# Patient Record
Sex: Male | Born: 1990 | Race: Black or African American | Hispanic: No | Marital: Single | Smoking: Former smoker
Health system: Southern US, Community
[De-identification: ages and names within clinical notes are randomized; demographics above are authoritative.]

## PROBLEM LIST (undated history)

## (undated) DIAGNOSIS — R569 Unspecified convulsions: Secondary | ICD-10-CM

## (undated) HISTORY — PX: KNEE SURGERY: SHX244

---
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
## 2011-03-05 ENCOUNTER — Emergency Department (HOSPITAL_COMMUNITY)
Admission: EM | Admit: 2011-03-05 | Discharge: 2011-03-05 | Disposition: A | Discharge status: Home / Self Care | Payer: Self-pay | Attending: Emergency Medicine | Admitting: Emergency Medicine

## 2011-03-05 DIAGNOSIS — N342 Other urethritis: Secondary | ICD-10-CM | POA: Insufficient documentation

## 2011-03-05 DIAGNOSIS — A64 Unspecified sexually transmitted disease: Secondary | ICD-10-CM | POA: Insufficient documentation

## 2011-03-12 ENCOUNTER — Emergency Department (HOSPITAL_COMMUNITY)
Admission: EM | Admit: 2011-03-12 | Discharge: 2011-03-12 | Disposition: A | Discharge status: Home / Self Care | Payer: Self-pay | Attending: Emergency Medicine | Admitting: Emergency Medicine

## 2011-03-12 DIAGNOSIS — R369 Urethral discharge, unspecified: Secondary | ICD-10-CM | POA: Insufficient documentation

## 2011-03-12 DIAGNOSIS — A64 Unspecified sexually transmitted disease: Secondary | ICD-10-CM | POA: Insufficient documentation

## 2012-04-25 ENCOUNTER — Emergency Department (HOSPITAL_COMMUNITY)
Admission: EM | Admit: 2012-04-25 | Discharge: 2012-04-25 | Disposition: A | Discharge status: Home / Self Care | Payer: Self-pay | Attending: Emergency Medicine | Admitting: Emergency Medicine

## 2012-04-25 ENCOUNTER — Encounter (HOSPITAL_COMMUNITY): Payer: Self-pay | Admitting: *Deleted

## 2012-04-25 ENCOUNTER — Emergency Department (HOSPITAL_COMMUNITY): Payer: Self-pay

## 2012-04-25 DIAGNOSIS — S62329A Displaced fracture of shaft of unspecified metacarpal bone, initial encounter for closed fracture: Secondary | ICD-10-CM | POA: Insufficient documentation

## 2012-04-25 DIAGNOSIS — F17200 Nicotine dependence, unspecified, uncomplicated: Secondary | ICD-10-CM | POA: Insufficient documentation

## 2012-04-25 DIAGNOSIS — M7989 Other specified soft tissue disorders: Secondary | ICD-10-CM | POA: Insufficient documentation

## 2012-04-25 DIAGNOSIS — S60229A Contusion of unspecified hand, initial encounter: Secondary | ICD-10-CM | POA: Insufficient documentation

## 2012-04-25 DIAGNOSIS — M79609 Pain in unspecified limb: Secondary | ICD-10-CM | POA: Insufficient documentation

## 2012-04-25 DIAGNOSIS — IMO0002 Reserved for concepts with insufficient information to code with codable children: Secondary | ICD-10-CM | POA: Insufficient documentation

## 2012-04-25 MED ORDER — TETANUS-DIPHTHERIA TOXOIDS TD 5-2 LFU IM INJ
0.5000 mL | INJECTION | Freq: Once | INTRAMUSCULAR | Status: AC
  Administered 2012-04-25: 0.5 mL via INTRAMUSCULAR
  Filled 2012-04-25: qty 0.5

## 2012-04-25 MED ORDER — OXYCODONE-ACETAMINOPHEN 5-325 MG PO TABS
1.0000 | ORAL_TABLET | Freq: Once | ORAL | Status: AC
  Administered 2012-04-25: 1 via ORAL
  Filled 2012-04-25: qty 1

## 2012-04-25 MED ORDER — OXYCODONE-ACETAMINOPHEN 5-325 MG PO TABS: 1.0000 | ORAL_TABLET | Freq: Four times a day (QID) | ORAL | Status: DC | PRN

## 2012-04-25 NOTE — ED Provider Notes (Signed)
Medical screening examination/treatment/procedure(s) were conducted as a shared visit with non-physician practitioner(s) and myself.  I personally evaluated the patient during the encounter Rhd.  Punched door.  C/o r hand pain and swelling.  + edema and ecchymosis over dorsolateral right hand.  xr + fx with angulation.  Not reduced after attempt in ed. Will discuss with hand surgeon.  Cheri Guppy, MD 04/25/12 1352

## 2012-04-25 NOTE — ED Notes (Signed)
Ortho tech at bedside applying splint. 

## 2012-04-25 NOTE — ED Notes (Signed)
Pt hit a car window with his rt hand, swelling to rt hand and 5th digit

## 2012-04-25 NOTE — ED Provider Notes (Signed)
Medical screening examination/treatment/procedure(s) were conducted as a shared visit with non-physician practitioner(s) and myself.  I personally evaluated the patient during the encounter  Cheri Guppy, MD 04/25/12 1537

## 2012-04-25 NOTE — ED Notes (Signed)
Ortho tech called for splint.

## 2012-04-25 NOTE — ED Provider Notes (Signed)
History     CSN: [id number]  Arrival date & time 04/25/12  1223   First MD Initiated Contact with Patient 04/25/12 1240      Chief Complaint  Patient presents with  . Hand Injury    (Consider location/radiation/quality/duration/timing/severity/associated sxs/prior treatment) HPI Comments: Patient presents emergency department chief complaint of right hand pain.  Earlier this morning patient punched a car window and had immediate swelling to his right hand proximal to fifth digit. He is right hand dominant.  Pain rated at a 4-5/10 and he denies any open wound or inability to move wrist or fingers. He also denies numbness or tingling of extremity or decreased sensation . No other complaints at this time.   Patient is a 21 y.o. male presenting with hand injury. The history is provided by the patient.  Hand Injury  Pertinent negatives include no fever.    History reviewed. No pertinent past medical history.  History reviewed. No pertinent past surgical history.  No family history on file.  History  Substance Use Topics  . Smoking status: Current Everyday Smoker  . Smokeless tobacco: Not on file  . Alcohol Use: No      Review of Systems  Constitutional: Negative for fever.  Skin: Negative for color change and wound.  All other systems reviewed and are negative.    Allergies  Review of patient's allergies indicates no known allergies.  Home Medications  No current outpatient prescriptions on file.  BP 140/79  Pulse 92  Temp 99.2 F (37.3 C) (Oral)  Resp 18  SpO2 100%  Physical Exam  Nursing note and vitals reviewed. Constitutional: He is oriented to person, place, and time. He appears well-developed and well-nourished. No distress.  HENT:  Head: Normocephalic and atraumatic.  Eyes: Conjunctivae and EOM are normal.  Neck: Normal range of motion.  Cardiovascular:       Distal pulses intact   Pulmonary/Chest: Effort normal.  Musculoskeletal: Normal range of  motion.       FROM of DIP & PIP of 5th digit. No pain w wrist ROM. Pt unable to perform flexion of MCPs.  Neurological: He is alert and oriented to person, place, and time.       Intact sensation  Skin: Skin is warm and dry. No rash noted. He is not diaphoretic.       Skin tenting over right mcp dorsal surface. Skin intact.  Psychiatric: He has a normal mood and affect. His behavior is normal.    ED Course  Procedures (including critical care time)  Labs Reviewed - No data to display Dg Hand Complete Right  04/25/2012  *RADIOLOGY REPORT*  Clinical Data: Punched window.  Hand pain and swelling.  RIGHT HAND - COMPLETE 3+ VIEW  Comparison: None.  Findings: A fracture of the distal fifth metacarpal shaft is seen with dorsal displacement by an entire shaft's width, and marked volar angulation of the distal fracture fragment.  No other fractures are identified.  No evidence of dislocation.  IMPRESSION: Distal fifth metacarpal shaft fracture, with marked dorsal displacement and volar angulation.  Original Report Authenticated By: Danae Orleans, M.D.     No diagnosis found.   MDM  Fifth metacarpal shaft fracture w almost 90 degree volar angulation  Patient X-Ray reviewed as above & discussed w patient. Pain managed in ED. Pt advised to call hand tomorrow morning. Pt placed in ulnar gutter splint. Patient given brace while in ED, conservative therapy recommended and discussed. Patient will be dc  home & is agreeable with above plan.  Consult Dr. Mina Marble, splint and call office first thing tomorrow morning, likely surgery tomorrow or tuesday           Jaci Carrel, [place] 04/25/12 1359

## 2012-04-26 ENCOUNTER — Other Ambulatory Visit: Payer: Self-pay | Admitting: Orthopedic Surgery

## 2012-04-27 ENCOUNTER — Encounter (HOSPITAL_COMMUNITY)
Admission: RE | Admit: 2012-04-27 | Discharge: 2012-04-27 | Disposition: A | Discharge status: Home / Self Care | Payer: Self-pay | Source: Ambulatory Visit | Attending: Orthopedic Surgery | Admitting: Orthopedic Surgery

## 2012-04-27 ENCOUNTER — Encounter (HOSPITAL_COMMUNITY): Payer: Self-pay | Admitting: Pharmacy Technician

## 2012-04-27 ENCOUNTER — Encounter (HOSPITAL_COMMUNITY): Payer: Self-pay

## 2012-04-27 HISTORY — DX: Unspecified convulsions: R56.9

## 2012-04-27 LAB — CBC
HCT: 44.6 % (ref 39.0–52.0)
MCV: 90.7 fL (ref 78.0–100.0)
RBC: 4.92 MIL/uL (ref 4.22–5.81)
WBC: 4.6 10*3/uL (ref 4.0–10.5)

## 2012-04-27 LAB — SURGICAL PCR SCREEN: Staphylococcus aureus: NEGATIVE

## 2012-04-27 MED ORDER — CEFAZOLIN SODIUM-DEXTROSE 2-3 GM-% IV SOLR
2.0000 g | INTRAVENOUS | Status: AC
  Administered 2012-04-28: 2 g via INTRAVENOUS
  Filled 2012-04-27: qty 50

## 2012-04-27 NOTE — Pre-Procedure Instructions (Signed)
21 Oscar Bowers  04/27/2012   Your procedure is scheduled on:  Wednesday July 17. 2013  Report to [facility] at 11:20 AM.  Call this number if you have problems the morning of surgery: [phone]   Remember:   Do not eat food or drink liquids:After Midnight.    Take these medicines the morning of surgery with A SIP OF WATER: oxycodone   Do not wear jewelry, make-up or nail polish.  Do not wear lotions, powders, or perfumes. You may wear deodorant.  Do not shave 48 hours prior to surgery. Men may shave face and neck.  Do not bring valuables to the hospital.  Contacts, dentures or bridgework may not be worn into surgery.  Leave suitcase in the car. After surgery it may be brought to your room.  For patients admitted to the hospital, checkout time is 11:00 AM the day of discharge.   Patients discharged the day of surgery will not be allowed to drive home.  Name and phone number of your driver: Keith Rake [phone]  Special Instructions: Incentive Spirometry - Practice and bring it with you on the day of surgery. and CHG Shower Use Special Wash: 1/2 bottle night before surgery and 1/2 bottle morning of surgery.   Please read over the following fact sheets that you were given: Pain Booklet, Coughing and Deep Breathing, MRSA Information and Surgical Site Infection Prevention

## 2012-04-28 ENCOUNTER — Ambulatory Visit (HOSPITAL_COMMUNITY): Payer: Self-pay | Admitting: Anesthesiology

## 2012-04-28 ENCOUNTER — Encounter (HOSPITAL_COMMUNITY): Payer: Self-pay | Admitting: Anesthesiology

## 2012-04-28 ENCOUNTER — Encounter (HOSPITAL_COMMUNITY): Payer: Self-pay | Admitting: *Deleted

## 2012-04-28 ENCOUNTER — Encounter (HOSPITAL_COMMUNITY)
Admission: RE | Disposition: A | Discharge status: Home / Self Care | Payer: Self-pay | Source: Ambulatory Visit | Attending: Orthopedic Surgery

## 2012-04-28 ENCOUNTER — Ambulatory Visit (HOSPITAL_COMMUNITY)
Admission: RE | Admit: 2012-04-28 | Discharge: 2012-04-28 | Disposition: A | Discharge status: Home / Self Care | Payer: Self-pay | Source: Ambulatory Visit | Attending: Orthopedic Surgery | Admitting: Orthopedic Surgery

## 2012-04-28 DIAGNOSIS — S62309A Unspecified fracture of unspecified metacarpal bone, initial encounter for closed fracture: Secondary | ICD-10-CM | POA: Insufficient documentation

## 2012-04-28 DIAGNOSIS — Z01812 Encounter for preprocedural laboratory examination: Secondary | ICD-10-CM | POA: Insufficient documentation

## 2012-04-28 DIAGNOSIS — F17200 Nicotine dependence, unspecified, uncomplicated: Secondary | ICD-10-CM | POA: Insufficient documentation

## 2012-04-28 DIAGNOSIS — Y33XXXA Other specified events, undetermined intent, initial encounter: Secondary | ICD-10-CM | POA: Insufficient documentation

## 2012-04-28 DIAGNOSIS — Y998 Other external cause status: Secondary | ICD-10-CM | POA: Insufficient documentation

## 2012-04-28 HISTORY — PX: ORIF FINGER FRACTURE: SHX2122

## 2012-04-28 SURGERY — OPEN REDUCTION INTERNAL FIXATION (ORIF) METACARPAL (FINGER) FRACTURE
Anesthesia: General | Site: Hand | Laterality: Right | Wound class: Clean

## 2012-04-28 MED ORDER — HYDROMORPHONE HCL PF 1 MG/ML IJ SOLN
0.2500 mg | INTRAMUSCULAR | Status: DC | PRN
  Administered 2012-04-28 (×3): 0.5 mg via INTRAVENOUS

## 2012-04-28 MED ORDER — LACTATED RINGERS IV SOLN
INTRAVENOUS | Status: DC
  Administered 2012-04-28: 12:00:00 via INTRAVENOUS

## 2012-04-28 MED ORDER — MIDAZOLAM HCL 5 MG/5ML IJ SOLN
INTRAMUSCULAR | Status: DC | PRN
  Administered 2012-04-28: 2 mg via INTRAVENOUS

## 2012-04-28 MED ORDER — ONDANSETRON HCL 4 MG/2ML IJ SOLN
INTRAMUSCULAR | Status: DC | PRN
  Administered 2012-04-28: 4 mg via INTRAVENOUS

## 2012-04-28 MED ORDER — OXYCODONE-ACETAMINOPHEN 5-325 MG PO TABS: 1.0000 | ORAL_TABLET | ORAL | Status: AC | PRN

## 2012-04-28 MED ORDER — OXYCODONE-ACETAMINOPHEN 5-325 MG PO TABS
1.0000 | ORAL_TABLET | Freq: Once | ORAL | Status: AC
  Administered 2012-04-28: 1 via ORAL

## 2012-04-28 MED ORDER — BUPIVACAINE HCL (PF) 0.25 % IJ SOLN
INTRAMUSCULAR | Status: DC | PRN
  Administered 2012-04-28: 10 mL

## 2012-04-28 MED ORDER — OXYCODONE-ACETAMINOPHEN 5-325 MG PO TABS
ORAL_TABLET | ORAL | Status: AC
  Administered 2012-04-28: 1 via ORAL
  Filled 2012-04-28: qty 1

## 2012-04-28 MED ORDER — PROPOFOL 10 MG/ML IV EMUL
INTRAVENOUS | Status: DC | PRN
  Administered 2012-04-28: 200 mg via INTRAVENOUS

## 2012-04-28 MED ORDER — FENTANYL CITRATE 0.05 MG/ML IJ SOLN
INTRAMUSCULAR | Status: DC | PRN
  Administered 2012-04-28: 25 ug via INTRAVENOUS
  Administered 2012-04-28: 100 ug via INTRAVENOUS

## 2012-04-28 MED ORDER — HYDROMORPHONE HCL PF 1 MG/ML IJ SOLN
INTRAMUSCULAR | Status: AC
  Administered 2012-04-28: 0.5 mg via INTRAVENOUS
  Filled 2012-04-28: qty 1

## 2012-04-28 MED ORDER — LACTATED RINGERS IV SOLN
INTRAVENOUS | Status: DC | PRN
  Administered 2012-04-28: 13:00:00 via INTRAVENOUS

## 2012-04-28 MED ORDER — 0.9 % SODIUM CHLORIDE (POUR BTL) OPTIME
TOPICAL | Status: DC | PRN
  Administered 2012-04-28: 1000 mL

## 2012-04-28 MED ORDER — CHLORHEXIDINE GLUCONATE 4 % EX LIQD: 60.0000 mL | Freq: Once | CUTANEOUS | Status: DC

## 2012-04-28 MED ORDER — LIDOCAINE HCL (CARDIAC) 20 MG/ML IV SOLN
INTRAVENOUS | Status: DC | PRN
  Administered 2012-04-28: 100 mg via INTRAVENOUS

## 2012-04-28 MED ORDER — HYDROMORPHONE HCL PF 1 MG/ML IJ SOLN
INTRAMUSCULAR | Status: AC
  Filled 2012-04-28: qty 1

## 2012-04-28 MED ORDER — BUPIVACAINE HCL (PF) 0.25 % IJ SOLN
INTRAMUSCULAR | Status: AC
  Filled 2012-04-28: qty 30

## 2012-04-28 SURGICAL SUPPLY — 60 items
BANDAGE ELASTIC 3 VELCRO ST LF (GAUZE/BANDAGES/DRESSINGS) IMPLANT
BANDAGE ELASTIC 4 VELCRO ST LF (GAUZE/BANDAGES/DRESSINGS) ×2 IMPLANT
BANDAGE GAUZE ELAST BULKY 4 IN (GAUZE/BANDAGES/DRESSINGS) ×2 IMPLANT
BIT DRILL 1.1 (BIT) ×2
BIT DRILL 60X20X1.1XQC TMX (BIT) ×1 IMPLANT
BIT DRL 60X20X1.1XQC TMX (BIT) ×1
BNDG ADH 5X3 H2O RPLNT NS (GAUZE/BANDAGES/DRESSINGS) ×2
BNDG CMPR 9X4 STRL LF SNTH (GAUZE/BANDAGES/DRESSINGS) ×1
BNDG COHESIVE 1X5 TAN STRL LF (GAUZE/BANDAGES/DRESSINGS) IMPLANT
BNDG COHESIVE 3X5 WHT NS (GAUZE/BANDAGES/DRESSINGS) ×4 IMPLANT
BNDG ESMARK 4X9 LF (GAUZE/BANDAGES/DRESSINGS) ×2 IMPLANT
CLOTH BEACON ORANGE TIMEOUT ST (SAFETY) ×2 IMPLANT
CORDS BIPOLAR (ELECTRODE) ×2 IMPLANT
COVER SURGICAL LIGHT HANDLE (MISCELLANEOUS) ×2 IMPLANT
CUFF TOURNIQUET SINGLE 18IN (TOURNIQUET CUFF) IMPLANT
CUFF TOURNIQUET SINGLE 24IN (TOURNIQUET CUFF) IMPLANT
DRAPE OEC MINIVIEW 54X84 (DRAPES) IMPLANT
DRAPE SURG 17X23 STRL (DRAPES) ×2 IMPLANT
DURAPREP 26ML APPLICATOR (WOUND CARE) ×2 IMPLANT
GAUZE SPONGE 2X2 8PLY STRL LF (GAUZE/BANDAGES/DRESSINGS) IMPLANT
GAUZE XEROFORM 1X8 LF (GAUZE/BANDAGES/DRESSINGS) ×2 IMPLANT
GLOVE BIO SURGEON STRL SZ8.5 (GLOVE) ×2 IMPLANT
GOWN SRG XL XLNG 56XLVL 4 (GOWN DISPOSABLE) ×1 IMPLANT
GOWN STRL NON-REIN LRG LVL3 (GOWN DISPOSABLE) ×2 IMPLANT
GOWN STRL NON-REIN XL XLG LVL4 (GOWN DISPOSABLE) ×2
GOWN STRL REIN XL XLG (GOWN DISPOSABLE) ×2 IMPLANT
KIT BASIN OR (CUSTOM PROCEDURE TRAY) ×2 IMPLANT
KIT ROOM TURNOVER OR (KITS) ×2 IMPLANT
MANIFOLD NEPTUNE II (INSTRUMENTS) ×2 IMPLANT
NEEDLE HYPO 25GX1X1/2 BEV (NEEDLE) IMPLANT
NEEDLE HYPO 25X1 1.5 SAFETY (NEEDLE) ×2 IMPLANT
NS IRRIG 1000ML POUR BTL (IV SOLUTION) ×2 IMPLANT
PACK ORTHO EXTREMITY (CUSTOM PROCEDURE TRAY) ×2 IMPLANT
PAD ARMBOARD 7.5X6 YLW CONV (MISCELLANEOUS) ×4 IMPLANT
PAD CAST 3X4 CTTN HI CHSV (CAST SUPPLIES) ×2 IMPLANT
PAD CAST 4YDX4 CTTN HI CHSV (CAST SUPPLIES) ×1 IMPLANT
PADDING CAST COTTON 3X4 STRL (CAST SUPPLIES) ×4
PADDING CAST COTTON 4X4 STRL (CAST SUPPLIES) ×2
PLATE STRAIGHT LOCK 1.5 (Plate) ×2 IMPLANT
SCREW NL 1.5X12 (Screw) ×8 IMPLANT
SCREW NL 1.5X13 (Screw) ×2 IMPLANT
SCREW NONIOC 1.5 16M (Screw) ×2 IMPLANT
SPLINT PLASTER EXTRA FAST 3X15 (CAST SUPPLIES) ×1
SPLINT PLASTER GYPS XFAST 3X15 (CAST SUPPLIES) ×1 IMPLANT
SPONGE GAUZE 2X2 STER 10/PKG (GAUZE/BANDAGES/DRESSINGS)
SPONGE GAUZE 4X4 12PLY (GAUZE/BANDAGES/DRESSINGS) ×2 IMPLANT
STRIP CLOSURE SKIN 1/2X4 (GAUZE/BANDAGES/DRESSINGS) IMPLANT
SUCTION FRAZIER TIP 10 FR DISP (SUCTIONS) ×2 IMPLANT
SUT ETHILON 4 0 PS 2 18 (SUTURE) IMPLANT
SUT ETHILON 5 0 PS 2 18 (SUTURE) IMPLANT
SUT PROLENE 3 0 PS 2 (SUTURE) IMPLANT
SUT VIC AB 3-0 FS2 27 (SUTURE) IMPLANT
SUT VIC AB 4-0 P-3 18X BRD (SUTURE) IMPLANT
SUT VIC AB 4-0 P3 18 (SUTURE)
SYR CONTROL 10ML LL (SYRINGE) ×2 IMPLANT
TOWEL OR 17X24 6PK STRL BLUE (TOWEL DISPOSABLE) ×2 IMPLANT
TOWEL OR 17X26 10 PK STRL BLUE (TOWEL DISPOSABLE) ×2 IMPLANT
TUBE CONNECTING 12X1/4 (SUCTIONS) ×2 IMPLANT
UNDERPAD 30X30 INCONTINENT (UNDERPADS AND DIAPERS) ×2 IMPLANT
WATER STERILE IRR 1000ML POUR (IV SOLUTION) ×2 IMPLANT

## 2012-04-28 NOTE — Preoperative (Signed)
Beta Blockers   Reason not to administer Beta Blockers:Not Applicable 

## 2012-04-28 NOTE — Discharge Instructions (Signed)
Hand Fracture, Fifth Metacarpal  The small metacarpal is the bone at the base of the little finger between the knuckle and the wrist. A fracture is a break in that bone. One of the fractures that is common to this bone is called a Boxer's Fracture.  TREATMENT  These fractures can be treated with:    Reduction (bones moved back into place), then pinned through the skin to maintain the position, and then casted for about 6 weeks or as your caregiver determines necessary.   ORIF (open reduction and internal fixation) - the fracture site is opened and the bone pieces are fixed into place with pins and then casted for approximately 6 weeks or as your caregiver determines necessary.  Your caregiver will discuss the type of fracture you have and the treatment that should be best for that problem. If surgery is the treatment of choice, the following is information for you to know, and also let your caregiver know about prior to surgery.   LET YOUR CAREGIVER KNOW ABOUT:   Allergies.   Medications taken including herbs, eye drops, over the counter medications, and creams.   Use of steroids (by mouth or creams).   Previous problems with anesthetics or novocaine.   Possibility of pregnancy, if this applies.   History of blood clots (thrombophlebitis).   History of bleeding or blood problems.   Previous surgery.   Other health problems.  AFTER THE PROCEDURE  After surgery, you will be taken to the recovery area where a nurse will watch and check your progress. Once you're awake, stable, and taking fluids well, barring other problems you'll be allowed to go home. Once home an ice pack applied to your operative site may help with discomfort and keep the swelling down.  HOME CARE INSTRUCTIONS    Follow your caregiver's instructions as to activities, exercises, physical therapy, and driving a car.   Daily exercise is helpful for maintaining range of motion (movement and mobility) and strength. Exercise as  instructed.   To lessen swelling, keep the injured hand elevated above the level of your heart as much as possible.   Apply ice to the injury for 15 to 20 minutes each hour while awake for the first 2 days. Put the ice in a plastic bag and place a thin towel between the bag of ice and your cast.   Move the fingers of your casted hand at least several times a day.   If a plaster or fiberglass cast was applied:   Do not try to scratch the skin under the cast using a sharp or pointed object.   Check the skin around the cast every day. You may put lotion on red or sore areas.   Keep your cast dry. Your cast can be protected during bathing with a plastic bag. Do not put your cast into the water.   If a plaster splint was applied:   Wear the splint for as long as directed by your caregiver or until seen for follow-up examination.   Do not get your splint wet. Protect it during bathing with a plastic bag.   You may loosen the elastic bandage around the splint if your fingers start to get numb, tingle, get cold or turn blue.   Do not put pressure on your cast or splint; this may cause it to break. Especially, do not lean plaster casts on hard surfaces for 24 hours after application.   Take medications as directed by your caregiver.     Only take over-the-counter or prescription medicines for pain, discomfort, or fever as directed by your caregiver.   Follow all instructions for physician referrals, physical therapy, and rehabilitation. Any delay in obtaining necessary care could result in permanent injury, disability and chronic pain.  SEEK MEDICAL CARE IF:    Increased bleeding (more than a small spot) from the wound or from beneath your cast or splint if there is a wound beneath the cast from surgery.   Redness, swelling, or increasing pain in the wound or from beneath your cast or splint.   Pus coming from wound or from beneath your cast or splint.   An unexplained oral temperature above 102 F (38.9 C)  develops.   A foul smell coming from the wound or dressing or from beneath your cast or splint.   You are unable to move your little finger.  SEEK IMMEDIATE MEDICAL CARE IF:   You develop a rash, have difficulty breathing, or have any allergy problems.  If you do not have a window in your cast for observing the wound, a discharge or minor bleeding may show up as a stain on the outside of your cast. Report these findings to your caregiver.  MAKE SURE YOU:    Understand these instructions.   Will watch your condition.   Will get help right away if you are not doing well or get worse.  Document Released: 01/05/2001 Document Revised: 09/18/2011 Document Reviewed: 05/18/2008  ExitCare Patient Information 2012 [employer].

## 2012-04-28 NOTE — Anesthesia Preprocedure Evaluation (Addendum)
Anesthesia Evaluation  Patient identified by MRN, date of birth, ID band Patient awake    Reviewed: Allergy & Precautions, H&P , NPO status , Patient's Chart, lab work & pertinent test results  Airway Mallampati: I TM Distance: >3 FB Neck ROM: Full    Dental No notable dental hx. (+) Teeth Intact   Pulmonary Current Smoker,  breath sounds clear to auscultation  Pulmonary exam normal       Cardiovascular negative cardio ROS  Rhythm:Regular Rate:Normal     Neuro/Psych Seizures -,  Last Sz age 33 negative psych ROS   GI/Hepatic negative GI ROS, Neg liver ROS,   Endo/Other  negative endocrine ROS  Renal/GU negative Renal ROS  negative genitourinary   Musculoskeletal   Abdominal   Peds  Hematology negative hematology ROS (+)   Anesthesia Other Findings   Reproductive/Obstetrics                          Anesthesia Physical Anesthesia Plan  ASA: II  Anesthesia Plan: General   Post-op Pain Management:    Induction: Intravenous  Airway Management Planned: LMA  Additional Equipment:   Intra-op Plan:   Post-operative Plan: Extubation in OR  Informed Consent: I have reviewed the patients History and Physical, chart, labs and discussed the procedure including the risks, benefits and alternatives for the proposed anesthesia with the patient or authorized representative who has indicated his/her understanding and acceptance.   Dental advisory given  Plan Discussed with: CRNA, Anesthesiologist and Surgeon  Anesthesia Plan Comments:         Anesthesia Quick Evaluation

## 2012-04-28 NOTE — Op Note (Signed)
See dictated note [id number]

## 2012-04-28 NOTE — Anesthesia Postprocedure Evaluation (Signed)
  Anesthesia Post-op Note  Patient: Oscar Bowers  Procedure(s) Performed: Procedure(s) (LRB): OPEN REDUCTION INTERNAL FIXATION (ORIF) METACARPAL (FINGER) FRACTURE (Right)  Patient Location: PACU  Anesthesia Type: General  Level of Consciousness: awake, alert  and oriented  Airway and Oxygen Therapy: Patient Spontanous Breathing  Post-op Pain: mild  Post-op Assessment: Post-op Vital signs reviewed, Patient's Cardiovascular Status Stable, Respiratory Function Stable, Patent Airway, No signs of Nausea or vomiting and Pain level controlled  Post-op Vital Signs: Reviewed and stable  Complications: No apparent anesthesia complications

## 2012-04-28 NOTE — Transfer of Care (Signed)
Immediate Anesthesia Transfer of Care Note  Patient: Oscar Bowers  Procedure(s) Performed: Procedure(s) (LRB): OPEN REDUCTION INTERNAL FIXATION (ORIF) METACARPAL (FINGER) FRACTURE (Right)  Patient Location: PACU  Anesthesia Type: General  Level of Consciousness: awake, alert  and oriented  Airway & Oxygen Therapy: Patient Spontanous Breathing and Patient connected to nasal cannula oxygen  Post-op Assessment: Report given to PACU RN  Post vital signs: Reviewed and stable  Complications: No apparent anesthesia complications

## 2012-04-28 NOTE — Brief Op Note (Signed)
04/28/2012  2:44 PM  PATIENT:  Ladene Artist  21 y.o. male  PRE-OPERATIVE DIAGNOSIS:  RIGHT SMALL METACARPAL FRACTURE  POST-OPERATIVE DIAGNOSIS:  RIGHT SMALL METACARPAL FRACTURE  PROCEDURE:  Procedure(s) (LRB): OPEN REDUCTION INTERNAL FIXATION (ORIF) METACARPAL (FINGER) FRACTURE (Right)  SURGEON:  Surgeon(s) and Role:    * Marlowe Shores, MD - Primary  PHYSICIAN ASSISTANT:   ASSISTANTS: none   ANESTHESIA:   general  EBL:  Total I/O In: 800 [I.V.:800] Out: 5 [Blood:5]  BLOOD ADMINISTERED:none  DRAINS: none   LOCAL MEDICATIONS USED:  MARCAINE   10cc  SPECIMEN:  No Specimen  DISPOSITION OF SPECIMEN:  N/A  COUNTS:  YES  TOURNIQUET:   Total Tourniquet Time Documented: Upper Arm (Right) - 57 minutes  DICTATION: .Other Dictation: Dictation Number [id number]  PLAN OF CARE: Discharge to home after PACU  PATIENT DISPOSITION:  PACU - hemodynamically stable.   Delay start of Pharmacological VTE agent (>24hrs) due to surgical blood loss or risk of bleeding: not applicable

## 2012-04-28 NOTE — H&P (Signed)
Oscar Bowers is an 21 y.o. male.   Chief Complaint: right small pain HPI: as above s/p right hand trauma with dis[placed small metacarpal fracture  Past Medical History  Diagnosis Date  . Seizures     stopped having seizures at 21 years old    Past Surgical History  Procedure Date  . Knee surgery     left knee    History reviewed. No pertinent family history. Social History:  reports that he has been smoking.  He does not have any smokeless tobacco history on file. He reports that he uses illicit drugs (Marijuana). He reports that he does not drink alcohol.  Allergies: No Known Allergies  Medications Prior to Admission  Medication Sig Dispense Refill  . oxyCODONE-acetaminophen (PERCOCET/ROXICET) 5-325 MG per tablet Take 1 tablet by mouth every 6 (six) hours as needed. For pain        Results for orders placed during the hospital encounter of 04/27/12 (from the past 48 hour(s))  SURGICAL PCR SCREEN     Status: Normal   Collection Time   04/27/12  3:59 PM      Component Value Range Comment   MRSA, PCR NEGATIVE  NEGATIVE    Staphylococcus aureus NEGATIVE  NEGATIVE   CBC     Status: Abnormal   Collection Time   04/27/12  4:16 PM      Component Value Range Comment   WBC 4.6  4.0 - 10.5 K/uL    RBC 4.92  4.22 - 5.81 MIL/uL    Hemoglobin 15.1  13.0 - 17.0 g/dL    HCT 16.1  09.6 - 04.5 %    MCV 90.7  78.0 - 100.0 fL    MCH 30.7  26.0 - 34.0 pg    MCHC 33.9  30.0 - 36.0 g/dL    RDW 40.9  81.1 - 91.4 %    Platelets 140 (*) 150 - 400 K/uL    No results found.  Review of Systems  All other systems reviewed and are negative.    Blood pressure 112/69, pulse 56, temperature 98.3 F (36.8 C), temperature source Oral, resp. rate 18, SpO2 99.00%. Physical Exam  Constitutional: He is oriented to person, place, and time. He appears well-developed and well-nourished.  HENT:  Head: Normocephalic and atraumatic.  Cardiovascular: Normal rate.   Respiratory: Effort normal.    Musculoskeletal:       Right hand: He exhibits tenderness, bony tenderness and deformity.  Neurological: He is alert and oriented to person, place, and time.  Skin: Skin is warm.  Psychiatric: He has a normal mood and affect. His speech is normal and behavior is normal. Thought content normal.     Assessment/Plan As above  Plan orif  Ellyn Rubiano A 04/28/2012, 1:00 PM

## 2012-04-29 NOTE — Op Note (Signed)
NAMESOLLY, DERASMO                ACCOUNT NO.:  [id number]  MEDICAL RECORD NO.:  [id number]  LOCATION:  [facility]                         FACILITY:  [facility]  PHYSICIAN:  Artist Pais. Derryck Shahan, M.D.DATE OF BIRTH:  11-Nov-1990  DATE OF PROCEDURE:  04/28/2012 DATE OF DISCHARGE:  04/28/2012                              OPERATIVE REPORT   PREOPERATIVE DIAGNOSIS:  Displaced right small finger metacarpal fracture.  POSTOPERATIVE DIAGNOSIS:  Displaced right small finger metacarpal fracture.  PROCEDURE:  Open reduction and internal fixation above using 6-hole 1.5 mm ALPS plate and screws.  SURGEON:  Artist Pais. Mina Marble, MD  ASSISTANT:  None.  ANESTHESIA:  General.  COMPLICATION:  No complications.  DRAINS:  No drains.  DESCRIPTION OF PROCEDURE:  The patient was taken to the operating suite. After induction of adequate general anesthesia, right upper extremity was prepped and draped in usual sterile fashion.  An Esmarch was used to exsanguinate the limb.  Tourniquet was inflated to 250 mmHg.  At this point in time, incision was made at the base of the metacarpal, small finger right hand, metaphyseal-diaphyseal flare.  Incision was carried down to the bone.  An IM rod was placed into the intramedullary canal and multiple attempts were made to reduce the mid to distal third fracture.  We were unable to do this.  The incision was then extended distally in a longitudinal fashion and the dissection carried down the fracture site, was debrided of clot.  Reduction was performed with a clamp.  The extensor tendons were displaced dorsoradially.  A 6-hole 1.5 mm ALPS plate was placed dorsally with 3 cortices proximal, 3 cortices distal to the fracture site with adequate reduction.  The wound was then thoroughly irrigated and closed in layers of 4-0 Vicryl and 4-0 Vicryl Rapide sutures on the skin.  Xeroform, 4 x 4s, fluffs, and ulnar gutter splint was applied.  The patient tolerated the procedure  well and went to recovery room in stable fashion.     Artist Pais Mina Marble, M.D.     MAW/MEDQ  D:  04/28/2012  T:  04/29/2012  Job:  [id number]

## 2012-04-30 ENCOUNTER — Encounter (HOSPITAL_COMMUNITY): Payer: Self-pay | Admitting: Orthopedic Surgery

## 2012-05-04 ENCOUNTER — Ambulatory Visit: Payer: Self-pay | Attending: Orthopedic Surgery | Admitting: Occupational Therapy

## 2012-05-04 DIAGNOSIS — M25649 Stiffness of unspecified hand, not elsewhere classified: Secondary | ICD-10-CM | POA: Insufficient documentation

## 2012-05-04 DIAGNOSIS — IMO0001 Reserved for inherently not codable concepts without codable children: Secondary | ICD-10-CM | POA: Insufficient documentation

## 2013-07-21 IMAGING — CR DG HAND COMPLETE 3+V*R*
3 series · 3 of 3 positions shown · non-contrast
Comparison: None.

CLINICAL DATA: Punched window.  Hand pain and swelling.

RIGHT HAND - COMPLETE 3+ VIEW

[x hand pa right]
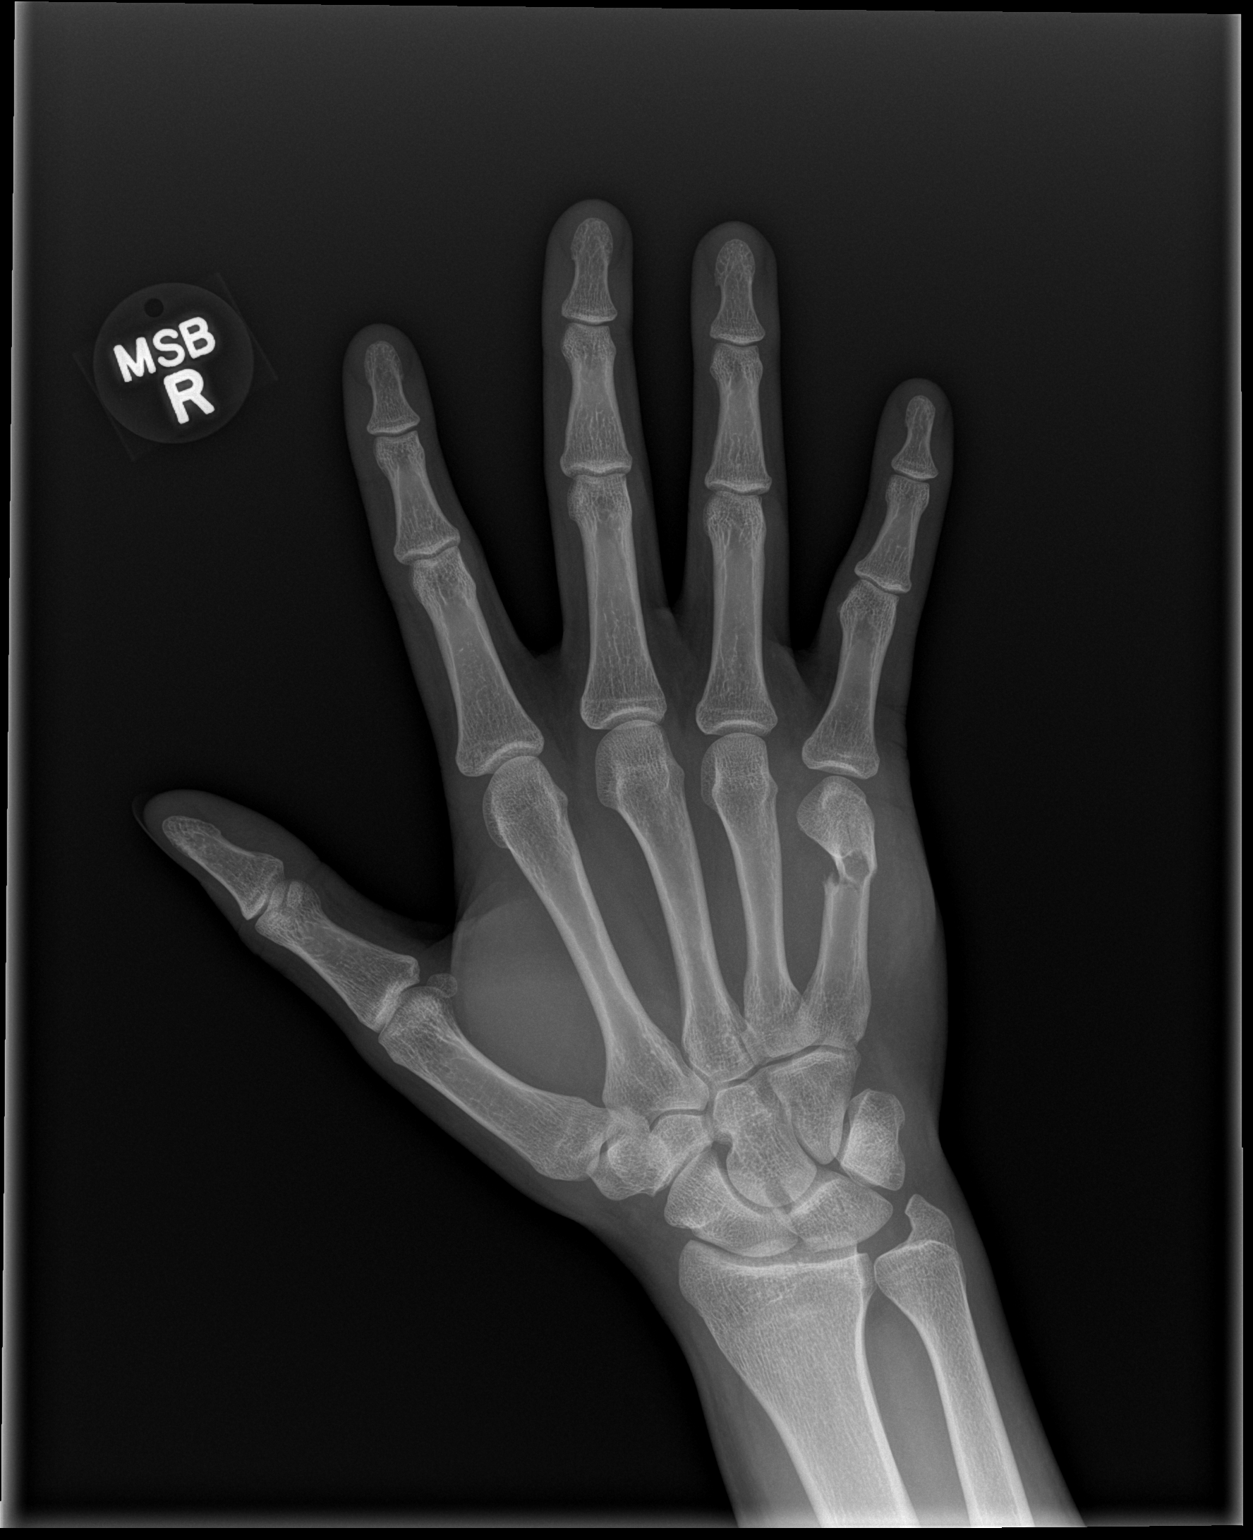

[x hand obl right]
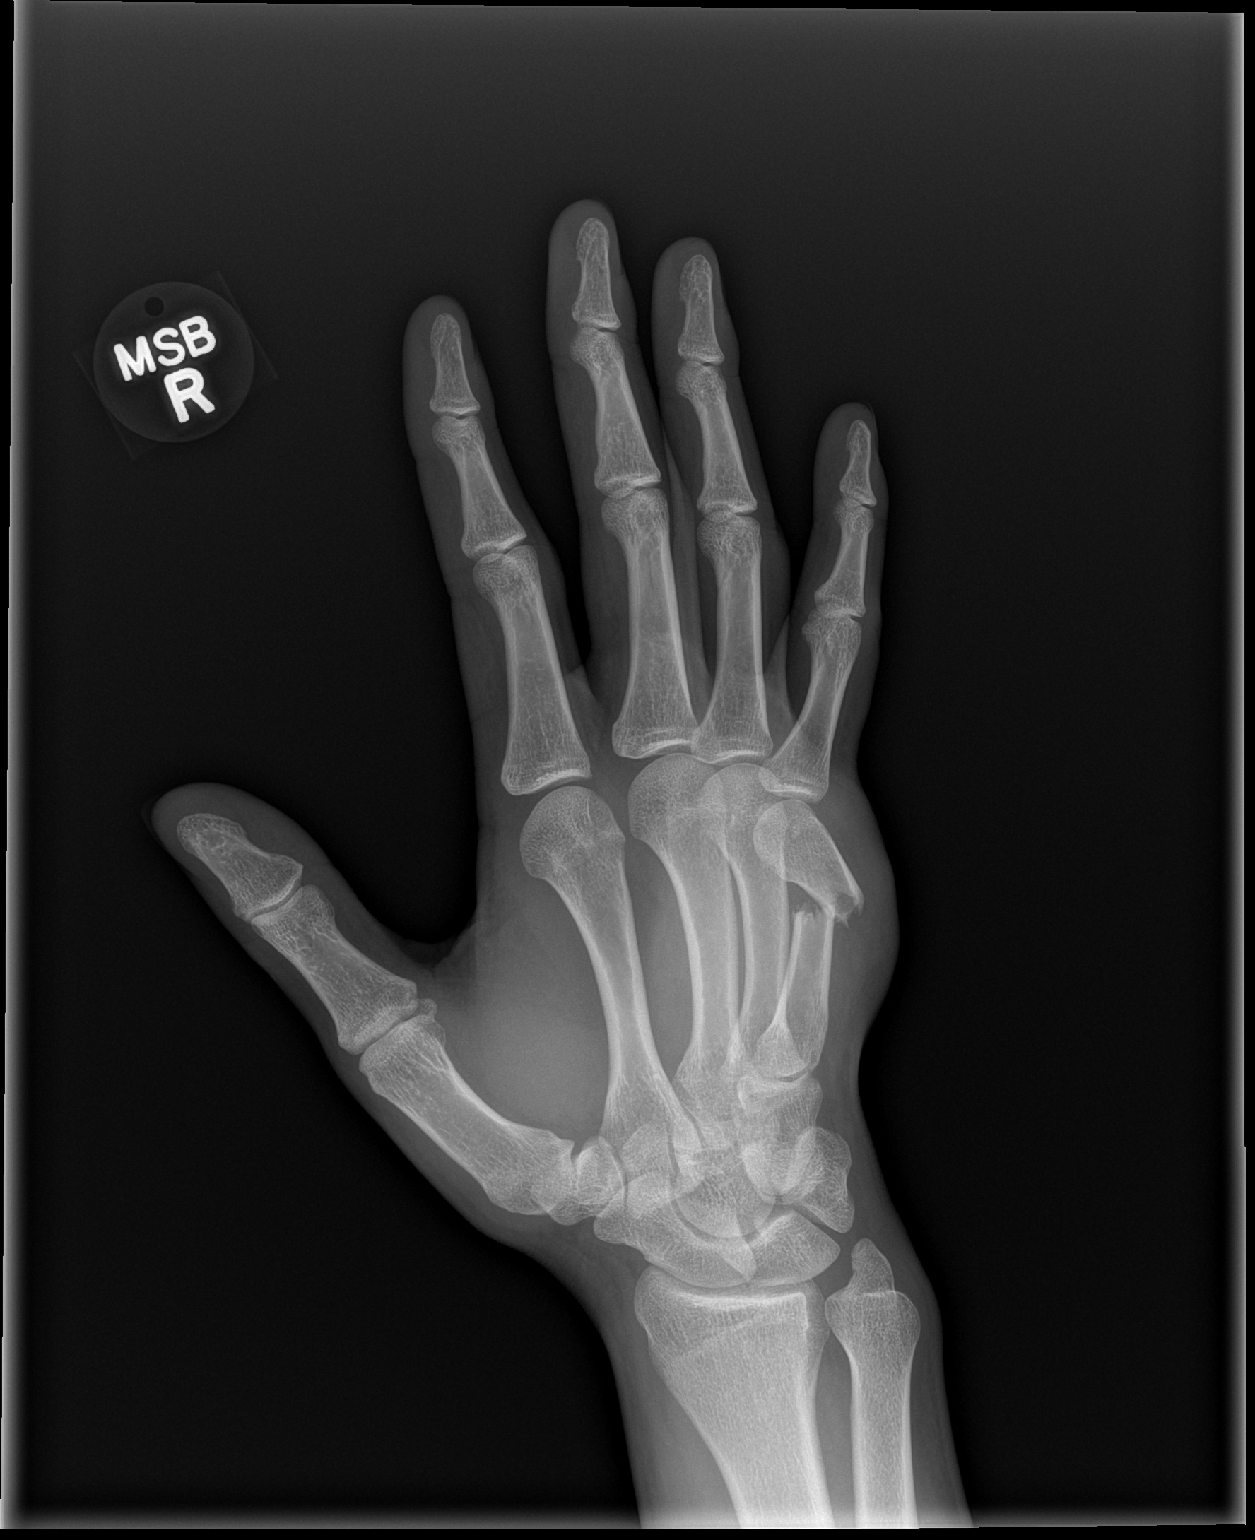

[x hand lat right]
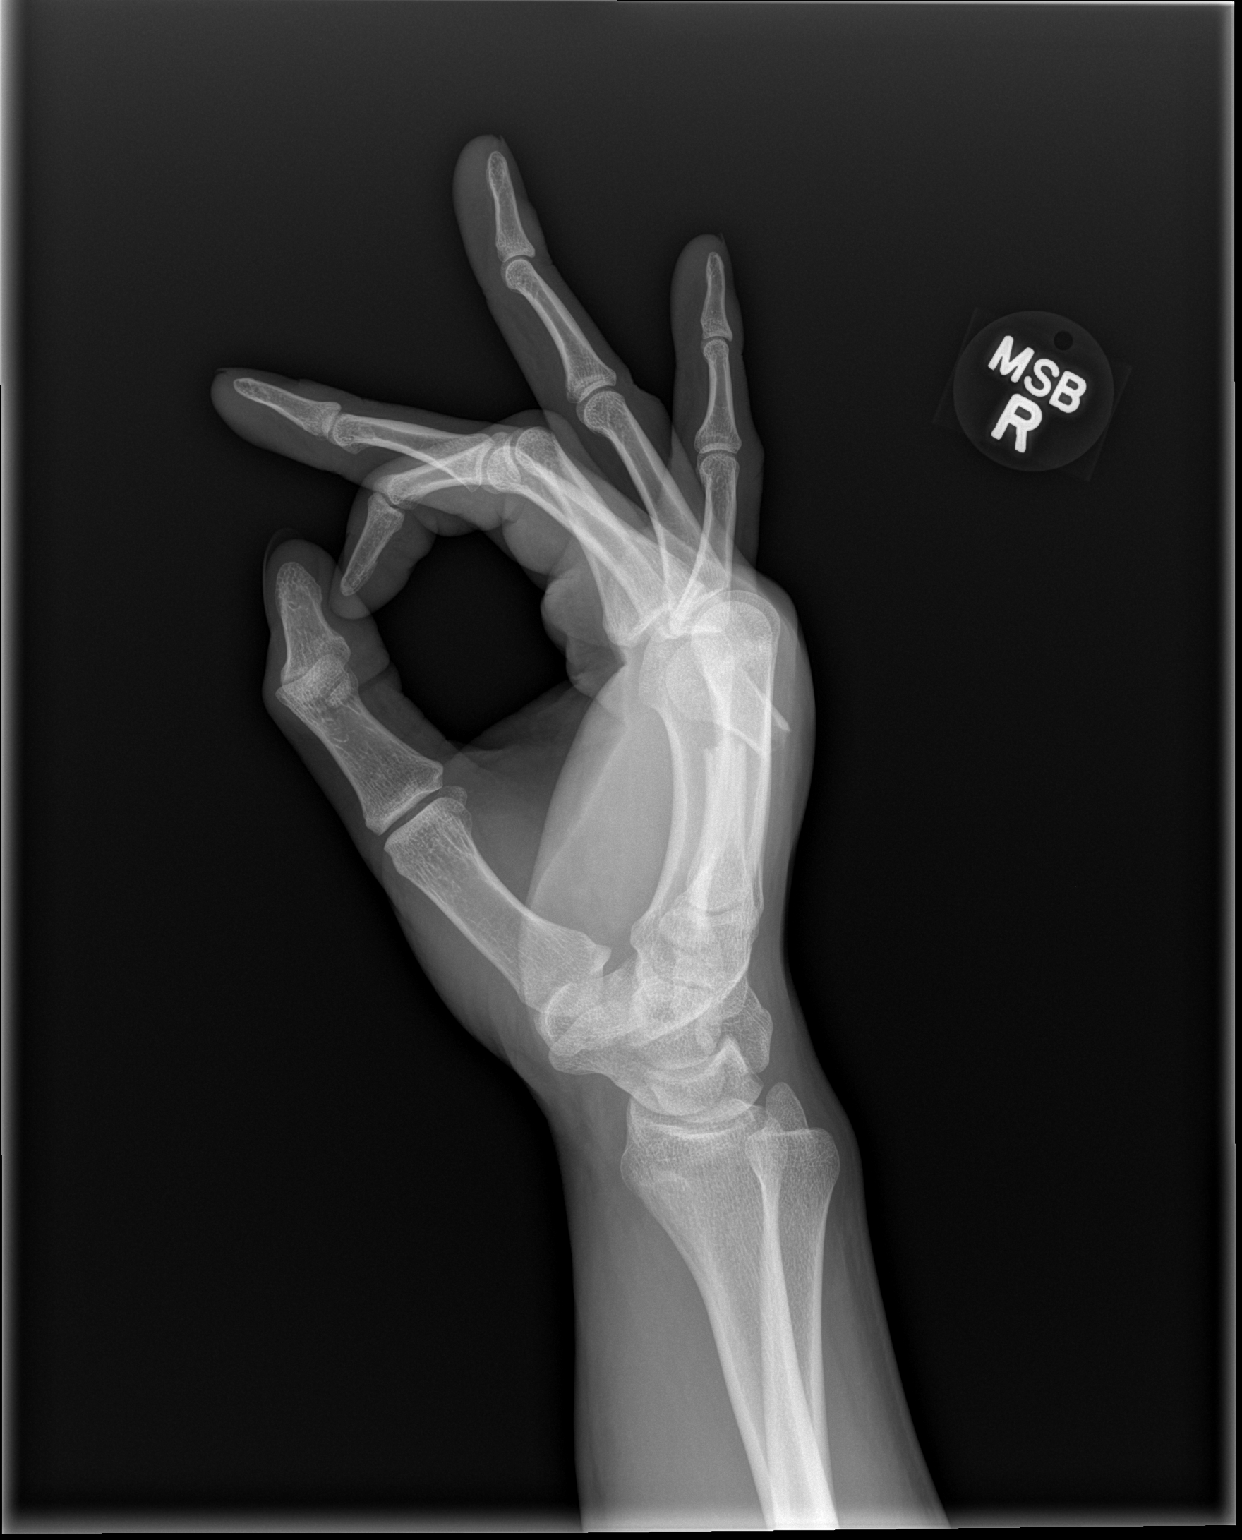

[3 of 3 positions shown; findings below may reference images not displayed]

FINDINGS: A fracture of the distal fifth metacarpal shaft is seen
with dorsal displacement by an entire shaft's width, and marked
volar angulation of the distal fracture fragment.

No other fractures are identified.  No evidence of dislocation.
IMPRESSION: Distal fifth metacarpal shaft fracture, with marked dorsal
displacement and volar angulation.

## 2013-07-21 IMAGING — CR DG HAND COMPLETE 3+V*R*
2 series · 2 of 2 positions shown · non-contrast
Comparison: Film earlier today.

CLINICAL DATA: Status post reduction of fifth metacarpal fracture.

RIGHT HAND - COMPLETE 3+ VIEW

[x hand pa right]
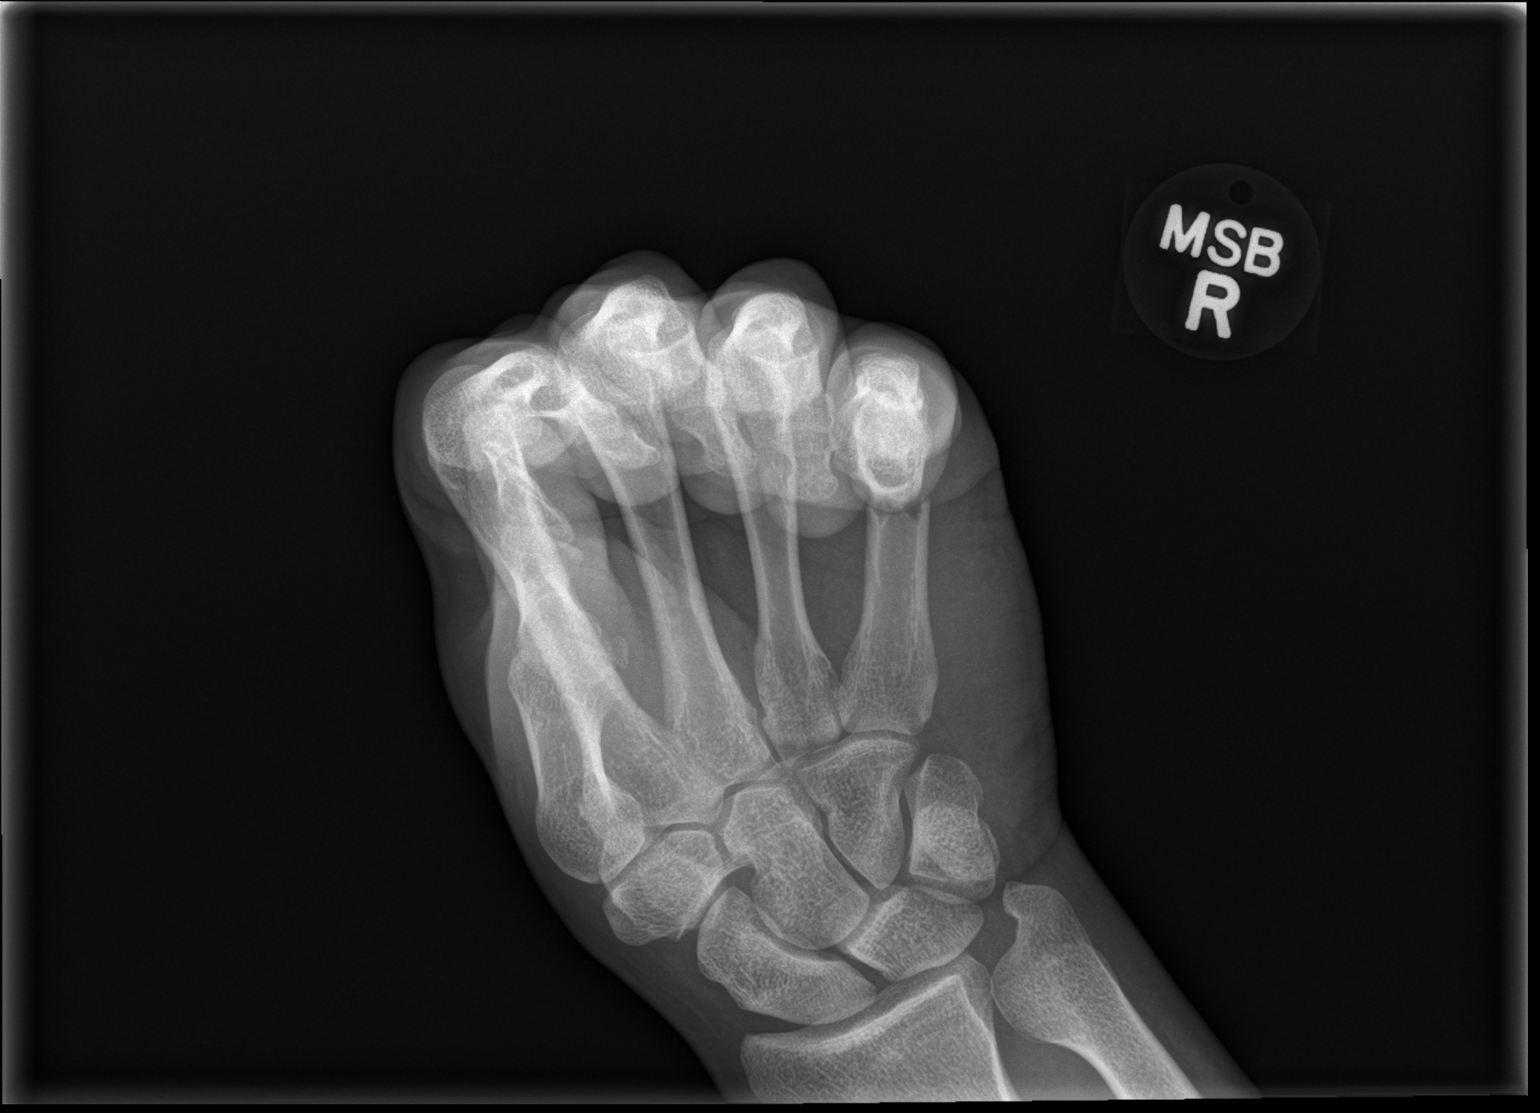

[x hand lat right]
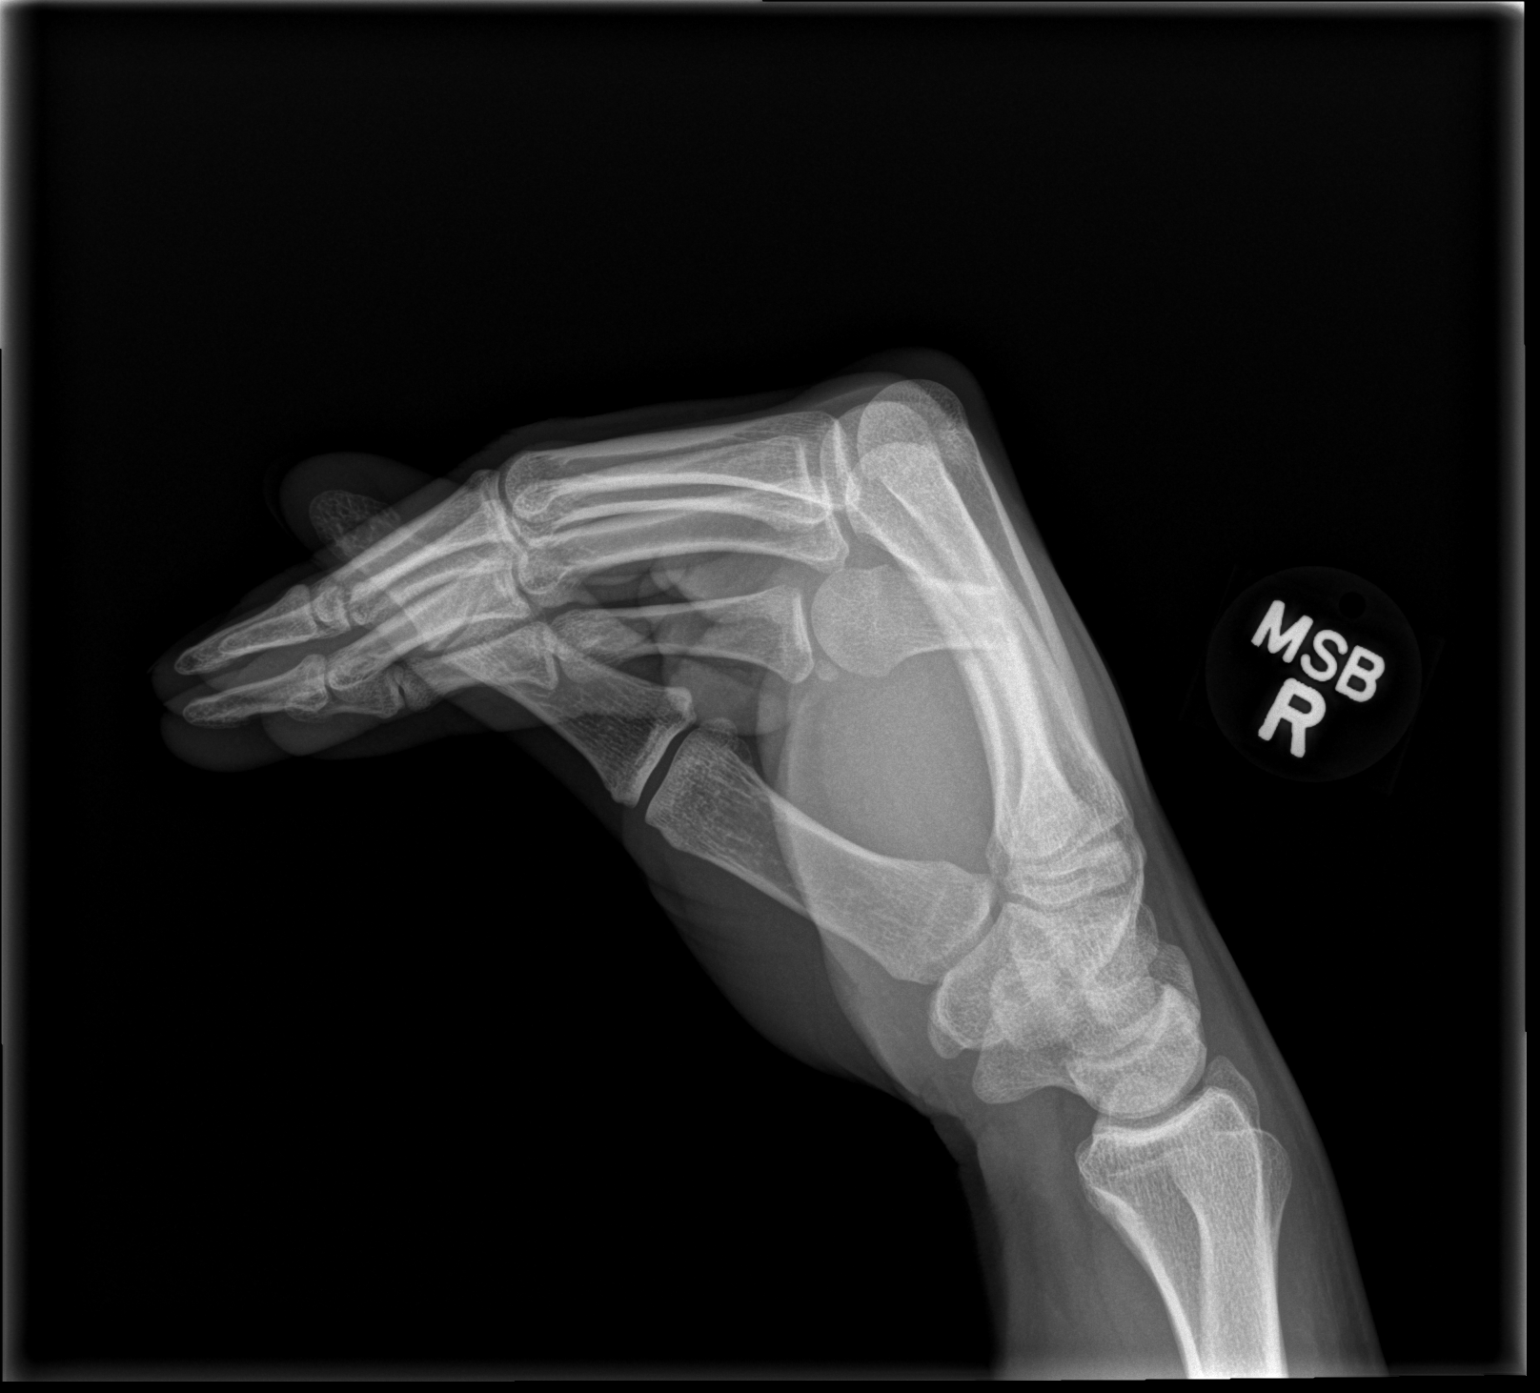

[2 of 2 positions shown; findings below may reference images not displayed]

FINDINGS: Improved AP alignment at the level of the fifth
metacarpal fracture.  In the lateral projection, there remains
significant volar angulation of nearly 90 degrees.
IMPRESSION: Improved AP alignment at the level of the fifth metacarpal
fracture.  Significant volar angulation remains.

## 2013-12-31 ENCOUNTER — Ambulatory Visit: Payer: Self-pay | Admitting: Physician Assistant

## 2013-12-31 VITALS — BP 122/80 | HR 56 | Temp 97.7°F | Resp 12 | Ht 68.5 in | Wt 178.0 lb

## 2013-12-31 DIAGNOSIS — Z0289 Encounter for other administrative examinations: Secondary | ICD-10-CM

## 2013-12-31 NOTE — Progress Notes (Signed)
   Subjective:    Patient ID: Oscar Bowers, male    DOB: 05/18/1991, 23 y.o.   MRN: [id number]  HPI   Mr. 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  He reports that he is in good health.  He denies medical problems or medication use.  He had surgery on his hand in 2013 due to a fracture but has no sequelae.  He does not wear corrective lenses.  He denies hx of HTN, heart disease, lung disease.  He admits to a history of seizures when he was 89 or 23 yrs old - he does not know why he had the seizures.  He reports that he has not been on seizure medications since age 89 and has had no seizures since age 89 (15 yrs ago.)     Review of Systems  Constitutional: Negative.   HENT: Negative.   Respiratory: Negative.   Cardiovascular: Negative.   Gastrointestinal: Negative.   Musculoskeletal: Negative.   Skin: Negative.        Objective:   Physical Exam  Vitals reviewed. Constitutional: He is oriented to person, place, and time. He appears well-developed and well-nourished. No distress.  HENT:  Head: Normocephalic and atraumatic.  Right Ear: Tympanic membrane and ear canal normal.  Left Ear: Tympanic membrane and ear canal normal.  Mouth/Throat: Uvula is midline, oropharynx is clear and moist and mucous membranes are normal.  Eyes: Conjunctivae and EOM are normal. Pupils are equal, round, and reactive to light. No scleral icterus.  Neck: Normal range of motion. Neck supple.  Cardiovascular: Normal rate, regular rhythm, normal heart sounds and intact distal pulses.   Pulmonary/Chest: Breath sounds normal. He has no wheezes. He has no rales.  Abdominal: Soft. Bowel sounds are normal. There is no tenderness.  Musculoskeletal: Normal range of motion. He exhibits no edema.  Lymphadenopathy:    He has no cervical adenopathy.  Neurological: He is alert and oriented to person, place, and time. He has normal strength and normal reflexes.  Skin: Skin is warm and dry.  Psychiatric: He has  a normal mood and affect. His behavior is normal.       Assessment & Plan:  Health examination of defined subpopulation  Mr. 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  He reports that he is in good health, and his exam is normal today.  He does have a history of seizure in childhood - circumstances unknown - but he reports he has not been on antiepileptic medicine or had a seizure in 15 yrs.  Per DOT regulations - a remote history of seizure with no medication for 10+ yrs and no seizures for 10+ yrs is eligible to be certified.  Since he meets these criteria, and has no other disqualifying or limiting conditions, he will be certified for 2 yrs.  Card and PPW completed  Pt to call or RTC if worsening or not improving  E. Frances FurbishElizabeth Saniyyah Elster MHS, PA-C [facility] 3/21/20155:23 PM

## 2018-01-29 ENCOUNTER — Encounter (HOSPITAL_BASED_OUTPATIENT_CLINIC_OR_DEPARTMENT_OTHER): Payer: Self-pay | Admitting: Adult Health

## 2018-01-29 ENCOUNTER — Emergency Department (HOSPITAL_BASED_OUTPATIENT_CLINIC_OR_DEPARTMENT_OTHER)
Admission: EM | Admit: 2018-01-29 | Discharge: 2018-01-29 | Disposition: A | Discharge status: Home / Self Care | Payer: Self-pay | Attending: Emergency Medicine | Admitting: Emergency Medicine

## 2018-01-29 ENCOUNTER — Other Ambulatory Visit: Payer: Self-pay

## 2018-01-29 DIAGNOSIS — Z87891 Personal history of nicotine dependence: Secondary | ICD-10-CM | POA: Insufficient documentation

## 2018-01-29 DIAGNOSIS — Z202 Contact with and (suspected) exposure to infections with a predominantly sexual mode of transmission: Secondary | ICD-10-CM | POA: Insufficient documentation

## 2018-01-29 LAB — URINALYSIS, ROUTINE W REFLEX MICROSCOPIC
BILIRUBIN URINE: NEGATIVE
GLUCOSE, UA: NEGATIVE mg/dL
Hgb urine dipstick: NEGATIVE
Ketones, ur: NEGATIVE mg/dL
Leukocytes, UA: NEGATIVE
NITRITE: NEGATIVE
PH: 7.5 (ref 5.0–8.0)
Protein, ur: NEGATIVE mg/dL
SPECIFIC GRAVITY, URINE: 1.01 (ref 1.005–1.030)

## 2018-01-29 MED ORDER — AZITHROMYCIN 250 MG PO TABS
1000.0000 mg | ORAL_TABLET | Freq: Once | ORAL | Status: AC
  Administered 2018-01-29: 1000 mg via ORAL
  Filled 2018-01-29: qty 4

## 2018-01-29 MED ORDER — CEFTRIAXONE SODIUM 250 MG IJ SOLR
250.0000 mg | Freq: Once | INTRAMUSCULAR | Status: AC
  Administered 2018-01-29: 250 mg via INTRAMUSCULAR
  Filled 2018-01-29: qty 250

## 2018-01-29 NOTE — ED Provider Notes (Signed)
[facility] EMERGENCY DEPARTMENT Provider Note   CSN: [id number] Arrival date & time: 01/29/18  0909     History   Chief Complaint Chief Complaint  Patient presents with  . Exposure to STD    HPI Oscar Bowers is a 27 y.o. male.  The history is provided by the patient and medical records. No language interpreter was used.  Exposure to STD  This is a new problem. The current episode started more than 1 week ago. The problem occurs constantly. The problem has not changed since onset.Pertinent negatives include no chest pain, no abdominal pain, no headaches and no shortness of breath. Nothing aggravates the symptoms. Nothing relieves the symptoms. He has tried nothing for the symptoms. The treatment provided no relief.    Past Medical History:  Diagnosis Date  . Seizures (HCC)    stopped having seizures at 27 years old    There are no active problems to display for this patient.   Past Surgical History:  Procedure Laterality Date  . KNEE SURGERY     left knee  . ORIF FINGER FRACTURE  04/28/2012   Procedure: OPEN REDUCTION INTERNAL FIXATION (ORIF) METACARPAL (FINGER) FRACTURE;  Surgeon: Marlowe ShoresMatthew A Weingold, MD;  Location: [facility] OR;  Service: Orthopedics;  Laterality: Right;        Home Medications    Prior to Admission medications   Not on File    Family History History reviewed. No pertinent family history.  Social History Social History   Tobacco Use  . Smoking status: Former Games developermoker  . Smokeless tobacco: Never Used  Substance Use Topics  . Alcohol use: No  . Drug use: Yes    Types: Marijuana    Comment: has not smoked marij in over 2 years     Allergies   Patient has no known allergies.   Review of Systems Review of Systems  Constitutional: Negative for activity change, chills, diaphoresis, fatigue and fever.  HENT: Negative for congestion and rhinorrhea.   Eyes: Negative for visual disturbance.  Respiratory: Negative for cough, chest  tightness, shortness of breath, wheezing and stridor.   Cardiovascular: Negative for chest pain, palpitations and leg swelling.  Gastrointestinal: Negative for abdominal distention, abdominal pain, blood in stool, constipation, diarrhea, nausea and vomiting.  Genitourinary: Negative for decreased urine volume, difficulty urinating, discharge, dysuria, flank pain, frequency, genital sores, hematuria, penile pain, penile swelling, scrotal swelling, testicular pain and urgency.  Musculoskeletal: Negative for back pain and gait problem.  Skin: Negative for rash and wound.  Neurological: Negative for dizziness, weakness, light-headedness and headaches.  Psychiatric/Behavioral: Negative for agitation.  All other systems reviewed and are negative.    Physical Exam Updated Vital Signs BP (!) 117/93 (BP Location: Right Arm)   Pulse 60   Temp 98 F (36.7 C) (Oral)   Resp 14   Ht 5\' 9"  (1.753 m)   Wt 72.6 kg (160 lb)   SpO2 99%   BMI 23.63 kg/m   Physical Exam  Constitutional: He appears well-developed and well-nourished. No distress.  HENT:  Head: Normocephalic and atraumatic.  Eyes: Conjunctivae are normal.  Neck: Neck supple.  Cardiovascular: Normal rate and regular rhythm.  No murmur heard. Pulmonary/Chest: Effort normal and breath sounds normal. No respiratory distress.  Abdominal: Soft. There is no tenderness. Hernia confirmed negative in the right inguinal area and confirmed negative in the left inguinal area.  Genitourinary: Penis normal. Right testis shows no swelling and no tenderness. Left testis shows no swelling and  no tenderness. Circumcised. No penile erythema or penile tenderness. No discharge found.  Musculoskeletal: He exhibits no edema or tenderness.  Lymphadenopathy: No inguinal adenopathy noted on the right or left side.  Neurological: He is alert.  Skin: Skin is warm and dry. He is not diaphoretic.  Psychiatric: He has a normal mood and affect.  Nursing note and  vitals reviewed.    ED Treatments / Results  Labs (all labs ordered are listed, but only abnormal results are displayed) Labs Reviewed  URINALYSIS, ROUTINE W REFLEX MICROSCOPIC  GC/CHLAMYDIA PROBE AMP ([place]) NOT AT [facility]    EKG None  Radiology No results found.  Procedures Procedures (including critical care time)  Medications Ordered in ED Medications  cefTRIAXone (ROCEPHIN) injection 250 mg (250 mg Intramuscular Given 01/29/18 1006)  azithromycin (ZITHROMAX) tablet 1,000 mg (1,000 mg Oral Given 01/29/18 1006)     Initial Impression / Assessment and Plan / ED Course  I have reviewed the triage vital signs and the nursing notes.  Pertinent labs & imaging results that were available during my care of the patient were reviewed by me and considered in my medical decision making (see chart for details).     Oscar Bowers is a 27 y.o. male with no significant past medical history who presents with concern for STI exposure.  Patient reports that his significant other found that last week during a OB/GYN work-up that she had gonorrhea.  Patient says that he has not had any symptoms personally but is concerned about this.  He denies any testicle pain, swelling, penile pain or drainage.  She denies dysuria.  He denies fevers, chills or any other symptoms.  On exam, GU is unremarkable.  Abdomen is nontender.  Testicle nontender.  No evidence of hernias.  Lungs clear and chest and back are nontender.  Patient will be treated empirically for STI exposure with azithromycin and Rocephin.  Patient will have GC and Chlamydia testing sent off.  Based on lack of groin, penile or rectal pain, doubt epididymitis, orchitis, or prostatitis.  No evidence of torsion on exam.  UA completely reassuring.  No evidence of UTI, blood, or protein.  Patient will follow-up with PCP for those results and understood return precautions for development of any symptoms.  Patient tolerated medications  well and had no other symptoms.  Patient discharged in good condition with understanding of return precautions and plan of care.    Final Clinical Impressions(s) / ED Diagnoses   Final diagnoses:  STD exposure    ED Discharge Orders    None     Clinical Impression: 1. STD exposure     Disposition: Discharge  Condition: Good  I have discussed the results, Dx and Tx plan with the pt(& family if present). He/she/they expressed understanding and agree(s) with the plan. Discharge instructions discussed at great length. Strict return precautions discussed and pt &/or family have verbalized understanding of the instructions. No further questions at time of discharge.    New Prescriptions   No medications on file    Follow Up: [facility] [address] [place] [address] [phone] Schedule an appointment as soon as possible for a visit    [facility] [address] [id number] [place] [address] [phone]       Tegeler, Canary Brim, MD 01/29/18 1058

## 2018-01-29 NOTE — ED Triage Notes (Signed)
Presents requesting testing for STD, he denies symptoms but endorses his significant other has gonorrhea. He is requesting testing and treatment.

## 2018-01-29 NOTE — Discharge Instructions (Signed)
We treated you for possible sexually transmitted infection given the known exposure from your significant other.  The test we sent will not be back today.  Please follow-up with your primary doctor for further assessment and determination of the other results.  If you develop any new or worsening symptoms, please return to the nearest emergency department.

## 2018-02-01 LAB — GC/CHLAMYDIA PROBE AMP (~~LOC~~) NOT AT ARMC
Chlamydia: NEGATIVE
Neisseria Gonorrhea: NEGATIVE

## 2018-04-18 ENCOUNTER — Other Ambulatory Visit: Payer: Self-pay

## 2018-04-18 ENCOUNTER — Emergency Department (HOSPITAL_COMMUNITY): Payer: Self-pay

## 2018-04-18 ENCOUNTER — Emergency Department (HOSPITAL_COMMUNITY)
Admission: EM | Admit: 2018-04-18 | Discharge: 2018-04-18 | Disposition: A | Discharge status: Home / Self Care | Payer: Self-pay | Attending: Emergency Medicine | Admitting: Emergency Medicine

## 2018-04-18 DIAGNOSIS — R112 Nausea with vomiting, unspecified: Secondary | ICD-10-CM | POA: Insufficient documentation

## 2018-04-18 DIAGNOSIS — R1013 Epigastric pain: Secondary | ICD-10-CM | POA: Insufficient documentation

## 2018-04-18 DIAGNOSIS — Z87891 Personal history of nicotine dependence: Secondary | ICD-10-CM | POA: Insufficient documentation

## 2018-04-18 LAB — COMPREHENSIVE METABOLIC PANEL
ALBUMIN: 5.1 g/dL — AB (ref 3.5–5.0)
ALK PHOS: 45 U/L (ref 38–126)
ALT: 21 U/L (ref 0–44)
AST: 21 U/L (ref 15–41)
Anion gap: 11 (ref 5–15)
BILIRUBIN TOTAL: 0.8 mg/dL (ref 0.3–1.2)
BUN: 13 mg/dL (ref 6–20)
CALCIUM: 9.9 mg/dL (ref 8.9–10.3)
CO2: 26 mmol/L (ref 22–32)
Chloride: 107 mmol/L (ref 98–111)
Creatinine, Ser: 0.85 mg/dL (ref 0.61–1.24)
GFR calc Af Amer: >60 mL/min (ref >60)
GFR calc non Af Amer: >60 mL/min (ref >60)
GLUCOSE: 122 mg/dL — AB (ref 70–99)
POTASSIUM: 3.7 mmol/L (ref 3.5–5.1)
SODIUM: 144 mmol/L (ref 135–145)
Total Protein: 8.2 g/dL — ABNORMAL HIGH (ref 6.5–8.1)

## 2018-04-18 LAB — URINALYSIS, ROUTINE W REFLEX MICROSCOPIC
BACTERIA UA: NONE SEEN
Bilirubin Urine: NEGATIVE
Glucose, UA: NEGATIVE mg/dL
Hgb urine dipstick: NEGATIVE
Ketones, ur: 5 mg/dL — AB
Leukocytes, UA: NEGATIVE
Nitrite: NEGATIVE
PROTEIN: 100 mg/dL — AB
SPECIFIC GRAVITY, URINE: 1.024 (ref 1.005–1.030)
pH: 9 — ABNORMAL HIGH (ref 5.0–8.0)

## 2018-04-18 LAB — CBC
HEMATOCRIT: 48.1 % (ref 39.0–52.0)
HEMOGLOBIN: 16.2 g/dL (ref 13.0–17.0)
MCH: 31.8 pg (ref 26.0–34.0)
MCHC: 33.7 g/dL (ref 30.0–36.0)
MCV: 94.3 fL (ref 78.0–100.0)
Platelets: 145 10*3/uL — ABNORMAL LOW (ref 150–400)
RBC: 5.1 MIL/uL (ref 4.22–5.81)
RDW: 12.5 % (ref 11.5–15.5)
WBC: 9 10*3/uL (ref 4.0–10.5)

## 2018-04-18 LAB — LIPASE, BLOOD: Lipase: 22 U/L (ref 11–51)

## 2018-04-18 MED ORDER — KETOROLAC TROMETHAMINE 30 MG/ML IJ SOLN
30.0000 mg | Freq: Once | INTRAMUSCULAR | Status: AC
  Administered 2018-04-18: 30 mg via INTRAVENOUS
  Filled 2018-04-18: qty 1

## 2018-04-18 MED ORDER — SODIUM CHLORIDE 0.9 % IV BOLUS
1000.0000 mL | Freq: Once | INTRAVENOUS | Status: AC
  Administered 2018-04-18: 1000 mL via INTRAVENOUS

## 2018-04-18 MED ORDER — DICYCLOMINE HCL 20 MG PO TABS: 20.0000 mg | ORAL_TABLET | Freq: Two times a day (BID) | ORAL | 0 refills | Status: AC

## 2018-04-18 MED ORDER — SUCRALFATE 1 G PO TABS: 1.0000 g | ORAL_TABLET | Freq: Three times a day (TID) | ORAL | 0 refills | Status: AC

## 2018-04-18 MED ORDER — ONDANSETRON 4 MG PO TBDP: 4.0000 mg | ORAL_TABLET | Freq: Three times a day (TID) | ORAL | 0 refills | Status: AC | PRN

## 2018-04-18 MED ORDER — ONDANSETRON HCL 4 MG/2ML IJ SOLN
4.0000 mg | Freq: Once | INTRAMUSCULAR | Status: AC
  Administered 2018-04-18: 4 mg via INTRAVENOUS
  Filled 2018-04-18: qty 2

## 2018-04-18 NOTE — ED Notes (Signed)
Pt states that urine sample was given in triage but not sent to lab, urinalysis appears discontinued at 16:15.

## 2018-04-18 NOTE — ED Notes (Signed)
Pt given ice water. No complaints of N&V at this time.

## 2018-04-18 NOTE — Discharge Instructions (Signed)

## 2018-04-18 NOTE — ED Triage Notes (Signed)
Pt to Ed via POV with c/o of abdominal pain since last night when he consumed 1/4th of brown liqour. Pt began vomitting last night which continued upon waking this morning. Pt states since he woke up he has had abdominal pain, diaphoresis, nausea, and emesis. Pt is A&O x 4 and denies falling or hitting his head. Pain in his abdomen is LUQ and 10/10

## 2018-04-18 NOTE — ED Provider Notes (Signed)
Emergency Department Provider Note   I have reviewed the triage vital signs and the nursing notes.   HISTORY  Chief Complaint Alcohol Intoxication; Abdominal Pain; and Emesis   HPI Oscar Bowers is a 27 y.o. male with remote history of seizures presents to the emergency department for evaluation of nausea, vomiting, abdominal pain in the setting of drinking last night.  The patient drink more alcohol than normal today began having vomiting and abdominal discomfort.  Denies any fevers or chills.  He continues to have bowel movements.  He denies daily drinking.  No history of pancreatitis.  He describes a "numb" pain in his upper abdomen that is worse with standing.  Denies any chest pain or difficulty breathing.  Patient has had multiple episodes of vomiting today that is non-bloody.   Past Medical History:  Diagnosis Date  . Seizures (HCC)    stopped having seizures at 27 years old    There are no active problems to display for this patient.   Past Surgical History:  Procedure Laterality Date  . KNEE SURGERY     left knee  . ORIF FINGER FRACTURE  04/28/2012   Procedure: OPEN REDUCTION INTERNAL FIXATION (ORIF) METACARPAL (FINGER) FRACTURE;  Surgeon: Marlowe Shores, MD;  Location: [facility] OR;  Service: Orthopedics;  Laterality: Right;      Allergies Patient has no known allergies.  No family history on file.  Social History Social History   Tobacco Use  . Smoking status: Former Games developer  . Smokeless tobacco: Never Used  Substance Use Topics  . Alcohol use: No  . Drug use: Yes    Types: Marijuana    Comment: has not smoked marij in over 2 years    Review of Systems  Constitutional: No fever/chills Eyes: No visual changes. ENT: No sore throat. Cardiovascular: Denies chest pain. Respiratory: Denies shortness of breath. Gastrointestinal: Positive epigastric abdominal pain. Positive nausea and vomiting.  No diarrhea.  No constipation. Genitourinary: Negative for  dysuria. Musculoskeletal: Negative for back pain. Skin: Negative for rash. Neurological: Negative for headaches, focal weakness or numbness.  10-point ROS otherwise negative.  ____________________________________________   PHYSICAL EXAM:  VITAL SIGNS: ED Triage Vitals  Enc Vitals Group     BP 04/18/18 1603 (!) 148/94     Pulse Rate 04/18/18 1603 65     Resp 04/18/18 1603 18     Temp 04/18/18 1603 98.2 F (36.8 C)     Temp Source 04/18/18 1603 Oral     SpO2 04/18/18 1603 97 %     Weight 04/18/18 1613 170 lb (77.1 kg)     Height 04/18/18 1613 5\' 9"  (1.753 m)     Pain Score 04/18/18 1612 10   Constitutional: Alert and oriented. Well appearing and in no acute distress. Eyes: Conjunctivae are normal.  Head: Atraumatic. Nose: No congestion/rhinnorhea. Mouth/Throat: Mucous membranes are moist.  Oropharynx non-erythematous. Neck: No stridor. Cardiovascular: Normal rate, regular rhythm. Good peripheral circulation. Grossly normal heart sounds.   Respiratory: Normal respiratory effort.  No retractions. Lungs CTAB. Gastrointestinal: Soft and nontender. No distention.  Musculoskeletal: No lower extremity tenderness nor edema. No gross deformities of extremities. Neurologic:  Normal speech and language. No gross focal neurologic deficits are appreciated.  Skin:  Skin is warm, dry and intact. No rash noted.  ____________________________________________   LABS (all labs ordered are listed, but only abnormal results are displayed)  Labs Reviewed  COMPREHENSIVE METABOLIC PANEL - Abnormal; Notable for the following components:  Result Value   Glucose, Bld 122 (*)    Total Protein 8.2 (*)    Albumin 5.1 (*)    All other components within normal limits  CBC - Abnormal; Notable for the following components:   Platelets 145 (*)    All other components within normal limits  URINALYSIS, ROUTINE W REFLEX MICROSCOPIC - Abnormal; Notable for the following components:   pH 9.0 (*)     Ketones, ur 5 (*)    Protein, ur 100 (*)    All other components within normal limits  LIPASE, BLOOD   ____________________________________________  RADIOLOGY  Dg Abdomen Acute W/chest  Result Date: 04/18/2018 CLINICAL DATA:  Abdominal pain and shortness of breath, states he drank much last night and experienced vomiting, vomiting again this morning with diaphoresis, associated with severe upper LEFT anterior and lateral abdominal pain EXAM: DG ABDOMEN ACUTE W/ 1V CHEST COMPARISON:  None FINDINGS: Normal heart size, mediastinal contours, and pulmonary vascularity. Lungs clear. No pleural effusion or pneumothorax. Bowel gas pattern normal. No bowel dilatation, bowel wall thickening, or free air. Osseous structures normal appearance. No urinary tract calcification. IMPRESSION: Normal exam. Electronically Signed   By: Ulyses Southward M.D.   On: 04/18/2018 19:37    ____________________________________________   PROCEDURES  Procedure(s) performed:   Procedures  None ____________________________________________   INITIAL IMPRESSION / ASSESSMENT AND PLAN / ED COURSE  Pertinent labs & imaging results that were available during my care of the patient were reviewed by me and considered in my medical decision making (see chart for details).  Patient presents to the emergency department for evaluation of epigastric abdominal pain and vomiting in the setting of drinking last night.  He has no tenderness to palpation of the abdomen.  Labs from triage reviewed which show normal lipase and no leukocytosis.  Liver enzymes and bilirubin are normal.  Patient denies any chest pain or difficulty breathing.  Will obtain acute abdomen x-ray but low suspicion for perforation based on exam.  Plan for IV fluids, nausea medication, pain medication and reassess.  Patient is likely experiencing an alcohol-related gastritis.   Labs and imaging reviewed. Patient feeling better after IVF and Zofran. Tolerating PO. WIll  discharge home with plan for supportive care and PCP follow up.   At this time, I do not feel there is any life-threatening condition present. I have reviewed and discussed all results (EKG, imaging, lab, urine as appropriate), exam findings with patient. I have reviewed nursing notes and appropriate previous records.  I feel the patient is safe to be discharged home without further emergent workup. Discussed usual and customary return precautions. Patient and family (if present) verbalize understanding and are comfortable with this plan.  Patient will follow-up with their primary care provider. If they do not have a primary care provider, information for follow-up has been provided to them. All questions have been answered.  ____________________________________________  FINAL CLINICAL IMPRESSION(S) / ED DIAGNOSES  Final diagnoses:  Epigastric abdominal pain  Non-intractable vomiting with nausea, unspecified vomiting type     MEDICATIONS GIVEN DURING THIS VISIT:  Medications  sodium chloride 0.9 % bolus 1,000 mL (1,000 mLs Intravenous New Bag/Given 04/18/18 1934)  ondansetron (ZOFRAN) injection 4 mg (4 mg Intravenous Given 04/18/18 1935)  ketorolac (TORADOL) 30 MG/ML injection 30 mg (30 mg Intravenous Given 04/18/18 1937)     NEW OUTPATIENT MEDICATIONS STARTED DURING THIS VISIT:  New Prescriptions   DICYCLOMINE (BENTYL) 20 MG TABLET    Take 1 tablet (20 mg total)  by mouth 2 (two) times daily.   ONDANSETRON (ZOFRAN ODT) 4 MG DISINTEGRATING TABLET    Take 1 tablet (4 mg total) by mouth every 8 (eight) hours as needed for nausea or vomiting.   SUCRALFATE (CARAFATE) 1 G TABLET    Take 1 tablet (1 g total) by mouth 4 (four) times daily -  with meals and at bedtime for 7 days.    Note:  This document was prepared using Dragon voice recognition software and may include unintentional dictation errors.  Alona BeneJoshua Jamoni Broadfoot, MD Emergency Medicine    Kensli Bowley, Arlyss RepressJoshua G, MD 04/18/18 2042
